# Patient Record
Sex: Male | Born: 2000 | Hispanic: No | Marital: Single | Smoking: Never smoker
Health system: Southern US, Community
[De-identification: ages and names within clinical notes are randomized; demographics above are authoritative.]

## PROBLEM LIST (undated history)

## (undated) ENCOUNTER — Emergency Department (HOSPITAL_COMMUNITY): Admission: EM | Payer: Self-pay | Source: Home / Self Care

---
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
## 2021-07-14 ENCOUNTER — Ambulatory Visit: Payer: BC Managed Care – PPO | Admitting: Pediatrics

## 2021-07-19 ENCOUNTER — Ambulatory Visit: Payer: BC Managed Care – PPO | Admitting: Pediatrics

## 2021-07-24 NOTE — Progress Notes (Deleted)
Adolescent Well Care Visit Kenneth Vaughan is a 20 y.o. male who is here for well care.    PCP:  Roxy Horseman, MD   History was provided by the {CHL AMB PERSONS; PED RELATIVES/OTHER W/PATIENT:[phone]}.  Confidentiality was discussed with the patient and, if applicable, with caregiver as well. Patient's personal or confidential phone number: ***  Current Issues: Current concerns include ***.   ***transition to adult provider No shows 09/01/20, 12/11,/21 canceled apts 07/14/21, 07/19/21  Nutrition: Nutrition/eating behaviors: *** Adequate calcium in diet?: *** Supplements/ vitamins: ***  Exercise/ Media: Play any sports? *** Exercise: *** Screen time:  {CHL AMB SCREEN [phone]} Media rules or monitoring?: {YES NO:22349}  Sleep:  Sleep: ***  Social Screening: Lives with:  ***mom, dad, sibs Parental relations:  {CHL AMB PED FAM RELATIONSHIPS:[phone]} Activities, work, and chores?: *** Concerns regarding behavior with peers?  {yes***/no:17258} Stressors of note: {Responses; yes**/no:17258}  Education: School grade and name: *** college? School performance: {performance:16655} School behavior: {misc; parental coping:16655}  Menstruation:   No LMP for male patient. Menstrual history: ***   Tobacco?  {YES/NO/WILD CARDS:18581} Secondhand smoke exposure?  {YES/NO/WILD GHWEX:93716} Drugs/ETOH?  {YES/NO/WILD RCVEL:38101}  Sexually Active?  {YES [id number]   Pregnancy Prevention: ***  Safe at home, in school & in relationships?  {Yes or If no, why not?:20788} Safe to self?  {Yes or If no, why not?:20788}   Screenings: Patient has a dental home: {yes/no***:64::"yes"}  The patient completed the Rapid Assessment for Adolescent Preventive Services screening questionnaire and the following topics were identified as risk factors and discussed: {CHL AMB ASSESSMENT TOPICS:21012045} and counseling provided.  Other topics of anticipatory guidance related to  reproductive health, substance use and media use were discussed.     PHQ-9 completed and results indicated ***  Physical Exam:  There were no vitals filed for this visit. There were no vitals taken for this visit. Body mass index: body mass index is unknown because there is no height or weight on file. Blood pressure percentiles are not available for patients who are 18 years or older.  No results found.  General Appearance:   {PE GENERAL APPEARANCE:22457}  HENT: normocephalic, no obvious abnormality, conjunctiva clear  Mouth:   oropharynx moist, palate, tongue and gums normal; teeth ***  Neck:   supple, no adenopathy; thyroid: symmetric, no enlargement, no tenderness/mass/nodules  Chest Normal male male with breasts: {EXAMLarrie Kass  Lungs:   clear to auscultation bilaterally, even air movement   Heart:   regular rate and rhythm, S1 and S2 normal, no murmurs   Abdomen:   soft, non-tender, normal bowel sounds; no mass, or organomegaly  GU {adol gu exam:315266}  Musculoskeletal:   tone and strength strong and symmetrical, all extremities full range of motion           Lymphatic:   no adenopathy  Skin/Hair/Nails:   skin warm and dry; no bruises, no rashes, no lesions  Neurologic:   oriented, no focal deficits; strength, gait, and coordination normal and age-appropriate     Assessment and Plan:   ***  BMI {ACTION; IS/IS BPZ:02585277} appropriate for age  Hearing screening result:{normal/abnormal/not examined:14677} Vision screening result: {normal/abnormal/not examined:14677}  Counseling provided for {CHL AMB PED VACCINE COUNSELING:210130100} vaccine components No orders of the defined types were placed in this encounter.    No follow-ups on file.Renato Gails, MD

## 2021-07-26 ENCOUNTER — Encounter: Payer: Self-pay | Admitting: Student in an Organized Health Care Education/Training Program

## 2021-07-26 ENCOUNTER — Ambulatory Visit: Payer: Medicaid Other | Admitting: Student in an Organized Health Care Education/Training Program

## 2021-07-26 IMAGING — DX DG CHEST 1V PORT
1 series · 1 of 1 positions shown · non-contrast
Comparison: None.

CLINICAL DATA: Fever for 3 days, generalized body aches

EXAM:
PORTABLE CHEST 1 VIEW

[chest]
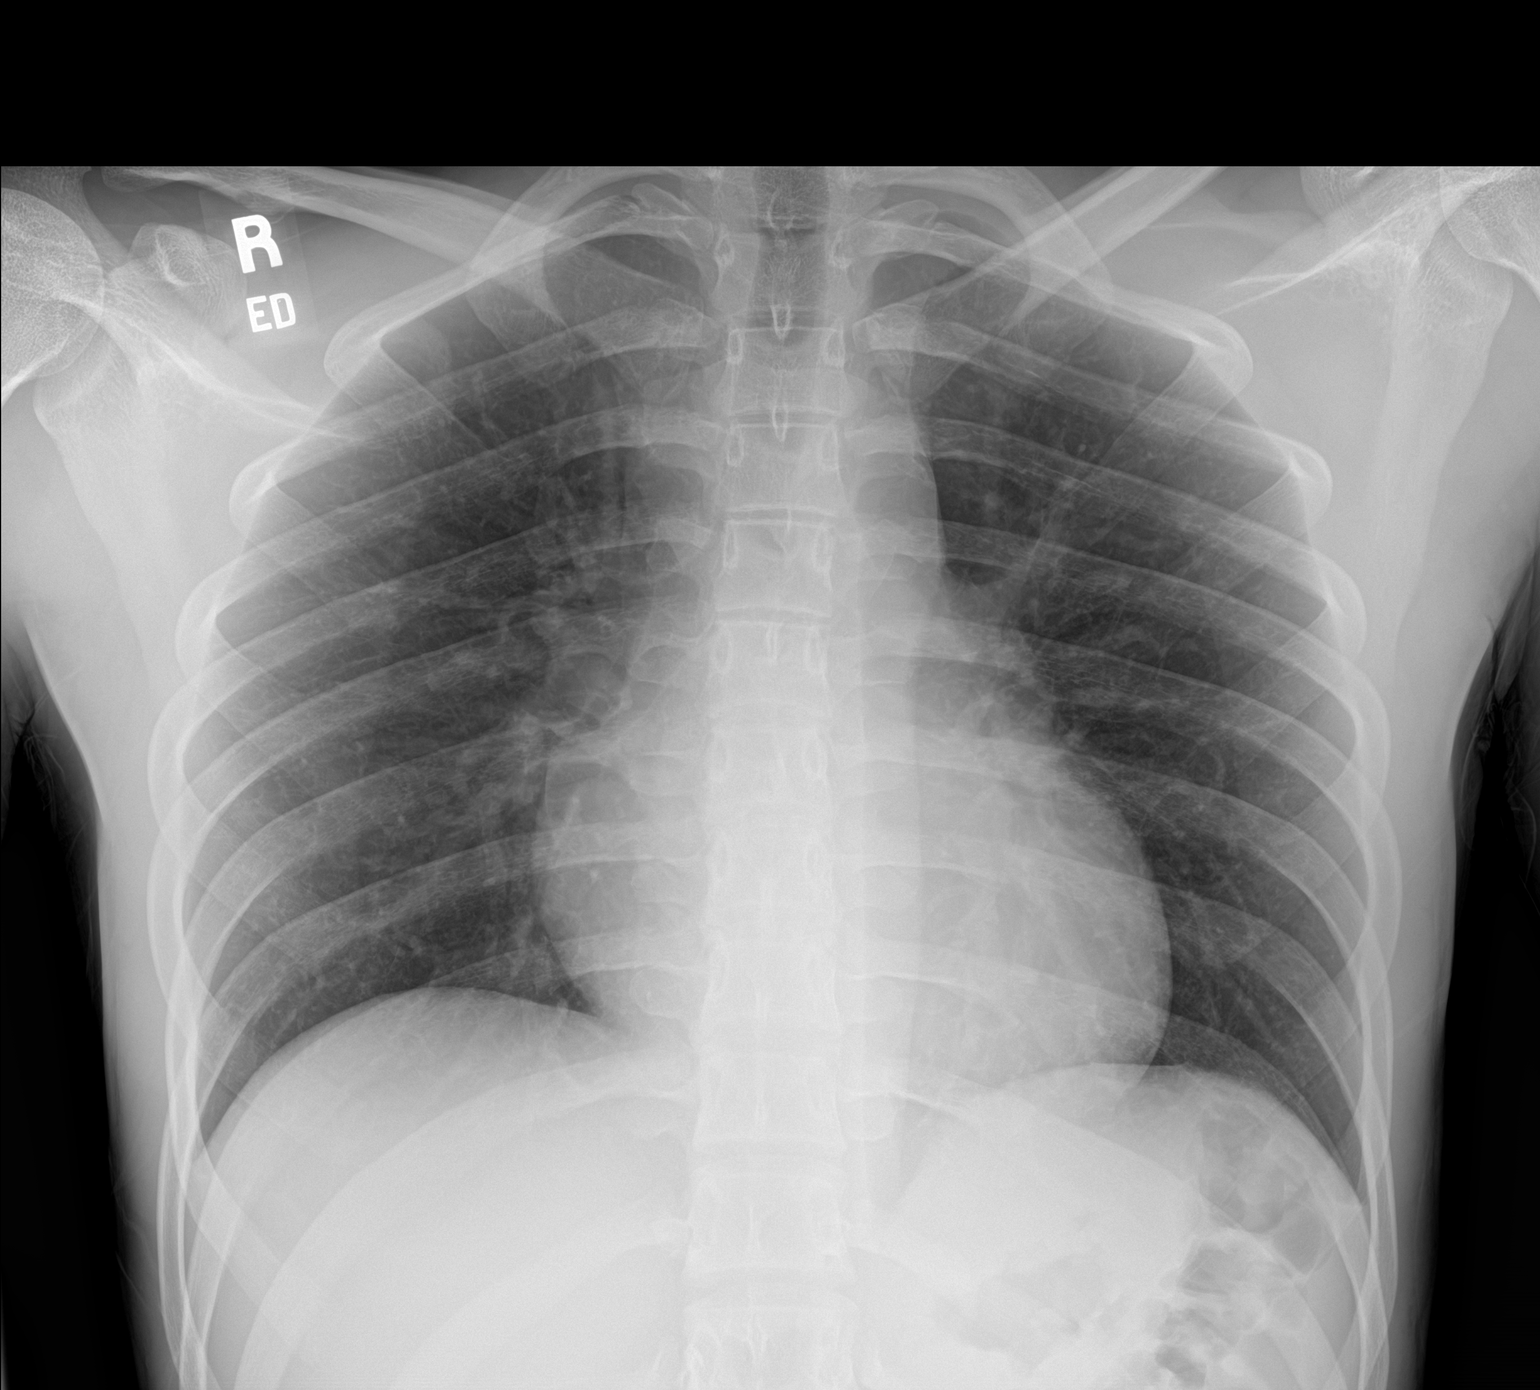

[1 of 1 positions shown; findings below may reference images not displayed]

FINDINGS: The heart size and mediastinal contours are within normal limits.
Both lungs are clear. The visualized skeletal structures are
unremarkable.
IMPRESSION: No active disease.

## 2021-07-30 ENCOUNTER — Ambulatory Visit (INDEPENDENT_AMBULATORY_CARE_PROVIDER_SITE_OTHER): Payer: Medicaid Other | Admitting: Pediatrics

## 2021-07-30 ENCOUNTER — Other Ambulatory Visit: Payer: Self-pay

## 2021-07-30 VITALS — HR 59 | Temp 99.2°F | Wt 159.4 lb

## 2021-07-30 DIAGNOSIS — S76111A Strain of right quadriceps muscle, fascia and tendon, initial encounter: Secondary | ICD-10-CM | POA: Diagnosis not present

## 2021-07-30 DIAGNOSIS — J069 Acute upper respiratory infection, unspecified: Secondary | ICD-10-CM | POA: Diagnosis not present

## 2021-07-30 NOTE — Patient Instructions (Addendum)
You were seen in clinic for cough and congestion. Your symptoms are consistent with a viral upper respiratory tract infection. Please self-swab at home for COVID.   1. Timeline for the common cold: Symptoms typically peak at 2-3 days of illness and then gradually improve over 10-14 days. However, a cough may last 2-4 weeks.   2. Please drink plenty of fluids. Eating warm liquids such as chicken soup or tea may also help with nasal congestion.  3. You can take acetaminophen (Tylenol) every 4-6 hours for pain or fever or take Ibuprofen (Advil or Motrin) every 6-8 hours for pain or fever. You may also alternate Tylenol with ibuprofen by giving one medication every 3 hours.   4.  You can buy a saline nose spray at the grocery store or the pharmacy to help with congestion.   5. You can have a spoonful of honey a few times a time. Older children may also suck on a hard candy or lozenge while awake. Can also try camomile or peppermint tea.  6. Please call your doctor if you are: Refusing to drink anything for a prolonged period Having behavior changes, including irritability or lethargy (decreased responsiveness) Having difficulty breathing, working hard to breathe, or breathing rapidly Has fever greater than 101F (38.4C) for more than three days Nasal congestion that does not improve or worsens over the course of 14 days The eyes become red or develop yellow discharge There are signs or symptoms of an ear infection (pain, drainage) Cough lasts more than 3 weeks   Lastly, you most likely strained your right quadriceps muscle. Continue with rest, elevation and Ibuprofen as needed as described above. Muscle strains can take 3-4 weeks to heal with proper rest. If you continue to experience pain after the next couple of weeks please call for re-evaluation, [facility] at [phone].

## 2021-07-30 NOTE — Progress Notes (Addendum)
History was provided by the patient.  Kenneth Vaughan is a 20 y.o. previously healthy male who is here for cough and congestion.     HPI:  Since Tuesday Kenneth Vaughan has felt sick. He had chest congestion, rhinorrhea, and was spitting out mucus with cough since Tuesday. When he coughs he has some chest tightness. He feels his symptoms are getting slightly better, especially yesterday but is a little worse today. Has tried Zyrtec and Ibuprofen as well as having warm tea to soothe throat. Is unsure if these remedies have helped. No rash, fever, difficulty breathing, chest pain, vomiting, diarrhea, dysuria, hematuria, nor bloody stools. He has had a normal appetite, eating and drinking normally. He is voiding and stooling regularly. Has not taken a COVID test at home and does not have any at home. 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 Lives with parents and his brother who had COVID 2-3 weeks ago. He is UTD on vaccines but did not get the covid vaccine. He has no medical problems, no surgeries, no meds, no allergies. He thinks this is a cold but was slightly concerned because had similar symptoms around Labor Day.   Also concerned for his right quad muscle. He was playing in a soccer game 2 weeks ago when he kicked the ball with his right leg and he heard a pop. He felt it was better this past week after symptomatic therapy at home with rest, ice, elevation and a massage gun, but getting out of the car today some pain returned. He is able to walk on it, but has pain with application of pressure and trying to stand up. No bruising, weakness, numbness nor tingling.    Physical Exam:  Pulse (!) 59   Temp 99.2 F (37.3 C) (Oral)   Wt 159 lb 6.4 oz (72.3 kg)   SpO2 98%   Blood pressure percentiles are not available for patients who are 18 years or older.   GEN: Well appearing, cooperative male teen, in no acute distress  HEENT: Normocephalic, atraumatic. PERRL.  Conjunctiva clear. TM normal bilaterally with bilateral soft cerumen. Nares patent with some crusted rhinorrhea and minor erythematous hypertrophied inferior turbinates. Oropharynx moist with no erythema, edema or exudate.  Neck: Supple. No lymphadenopathy.  CV: Regular rate and rhythm. No murmurs, rubs or gallops. Normal capillary refill. No peripheral edema.   RESP: Normal work of breathing. Lungs clear to auscultation bilaterally with no wheezes, rales nor crackles.  GI: Abdomen soft, normal bowel sounds, non-tender, non-distended with no hepatosplenomegaly or masses.  GU: Not examined.  MSK: Grossly normal, active and moving without pain. Right LE quad muscle and tendon without tenderness to palpation nor swelling. Full ROM in bilateral hip flexion, extension, external and internal rotation. Full ROM in  bilateral knee flexion, extension, external and internal rotation.   NEURO: No focal deficits. Normal tone. LE strength 5/5 bilaterally except 4/5 on right in hip flexion. LE sensation intact bilaterally. LE DTRs 2+ bilaterally. Normal gait.    Assessment/Plan:  Kenneth Vaughan is a previously healthy 20 year old male who is presenting with 4 days of cough and congestion. Today he is well appearing, afebrile, not in any respiratory distress, with minor crusted rhinorrhea, and otherwise has an unremarkable exam. Presentation most consistent with viral URI.  Patient opted to self swab at home for COVID and I encouraged to continue symptomatic care at home. Patient's lower extremity exam was within normal limits, including full range of  motion at both right hip and knee without any tenderness to palpation nor swelling on exam of the right quadriceps muscle tendon or knee. Given patient's history of a soccer injury 2 weeks ago and today's presentation, likely right quadriceps muscle strain.  Encouraged patient to continue with rest, elevation, and NSAIDs as needed. Provided him the number for the Sports  Medicine clinic should symptoms not improve in the next couple of weeks.  1. Viral URI with cough  2. Quadriceps muscle strain, right, initial encounter  - Immunizations today: None  - Follow-up as needed should symptoms worsen. Recommended establish care with adult / family medicine for PCP given 20 years of age. Also provided number for Sports Medicine should symptoms worsen.    Arlyce Harman, DO  07/30/21

## 2021-07-31 ENCOUNTER — Encounter: Payer: Self-pay | Admitting: Pediatrics

## 2021-10-20 ENCOUNTER — Ambulatory Visit: Payer: Medicaid Other | Admitting: Pediatrics

## 2021-10-20 ENCOUNTER — Encounter: Payer: Self-pay | Admitting: Pediatrics

## 2021-10-20 NOTE — Progress Notes (Deleted)
Adolescent Well Care Visit Kenneth Vaughan is a 21 y.o. male who is here for well care.    PCP:  Roxy Horseman, MD   History was provided by the {CHL AMB PERSONS; PED RELATIVES/OTHER W/PATIENT:[phone]}.  Confidentiality was discussed with the patient and, if applicable, with caregiver as well. Patient's personal or confidential phone number: ***  Current Issues: Current concerns include ***.   Last wcc was April 2021 H/o Seasonal allergies- zyrtec Mild intermittent asthma Acne Plantar wart  Nutrition: Nutrition/eating behaviors: *** Adequate calcium in diet?: *** Supplements/ vitamins: ***  Exercise/ Media: Play any sports? *** Exercise: *** Screen time:  {CHL AMB SCREEN [phone]} Media rules or monitoring?: {YES NO:22349}  Sleep:  Sleep: ***  Social Screening: Lives with:  *** Parental relations:  {CHL AMB PED FAM RELATIONSHIPS:[phone]} Activities, work, and chores?: *** Concerns regarding behavior with peers?  {yes***/no:17258} Stressors of note: {Responses; yes**/no:17258}  Education: School grade and name: ***  School performance: {performance:16655} School behavior: {misc; parental coping:16655}  Menstruation:   No LMP for male patient. Menstrual history: ***   Tobacco?  {YES/NO/WILD CARDS:18581} Secondhand smoke exposure?  {YES/NO/WILD IDPOE:42353} Drugs/ETOH?  {YES/NO/WILD IRWER:15400}  Sexually Active?  {YES [id number]   Pregnancy Prevention: ***  Safe at home, in school & in relationships?  {Yes or If no, why not?:20788} Safe to self?  {Yes or If no, why not?:20788}   Screenings: Patient has a dental home: {yes/no***:64::"yes"}  The patient completed the Rapid Assessment for Adolescent Preventive Services screening questionnaire and the following topics were identified as risk factors and discussed: {CHL AMB ASSESSMENT TOPICS:[id number]} and counseling provided.  Other topics of anticipatory guidance related to reproductive health,  substance use and media use were discussed.     PHQ-9 completed and results indicated ***  Physical Exam:  There were no vitals filed for this visit. There were no vitals taken for this visit. Body mass index: body mass index is unknown because there is no height or weight on file. Blood pressure percentiles are not available for patients who are 18 years or older.  No results found.  General Appearance:   {PE GENERAL APPEARANCE:22457}  HENT: normocephalic, no obvious abnormality, conjunctiva clear  Mouth:   oropharynx moist, palate, tongue and gums normal; teeth ***  Neck:   supple, no adenopathy; thyroid: symmetric, no enlargement, no tenderness/mass/nodules  Chest Normal male male with breasts: {EXAMLarrie Kass  Lungs:   clear to auscultation bilaterally, even air movement   Heart:   regular rate and rhythm, S1 and S2 normal, no murmurs   Abdomen:   soft, non-tender, normal bowel sounds; no mass, or organomegaly  GU {adol gu exam:315266}  Musculoskeletal:   tone and strength strong and symmetrical, all extremities full range of motion           Lymphatic:   no adenopathy  Skin/Hair/Nails:   skin warm and dry; no bruises, no rashes, no lesions  Neurologic:   oriented, no focal deficits; strength, gait, and coordination normal and age-appropriate     Assessment and Plan:   ***  BMI {ACTION; IS/IS QQP:[id number]} appropriate for age  Hearing screening result:{normal/abnormal/not examined:14677} Vision screening result: {normal/abnormal/not examined:14677}  Counseling provided for {CHL AMB PED VACCINE COUNSELING:210130100} vaccine components No orders of the defined types were placed in this encounter.    No follow-ups on file.Renato Gails, MD

## 2023-01-27 ENCOUNTER — Ambulatory Visit: Payer: Medicaid Other | Admitting: Internal Medicine

## 2023-08-02 ENCOUNTER — Ambulatory Visit: Payer: Medicaid Other | Admitting: Family

## 2023-08-24 ENCOUNTER — Ambulatory Visit: Payer: Self-pay | Admitting: Family

## 2023-08-24 ENCOUNTER — Other Ambulatory Visit (HOSPITAL_COMMUNITY)
Admission: RE | Admit: 2023-08-24 | Discharge: 2023-08-24 | Disposition: A | Discharge status: Home / Self Care | Payer: BC Managed Care – PPO | Source: Ambulatory Visit | Attending: Family | Admitting: Family

## 2023-08-24 VITALS — HR 60 | Ht 73.0 in

## 2023-08-24 DIAGNOSIS — Z0000 Encounter for general adult medical examination without abnormal findings: Secondary | ICD-10-CM | POA: Diagnosis not present

## 2023-08-24 DIAGNOSIS — F17200 Nicotine dependence, unspecified, uncomplicated: Secondary | ICD-10-CM | POA: Insufficient documentation

## 2023-08-24 DIAGNOSIS — Z113 Encounter for screening for infections with a predominantly sexual mode of transmission: Secondary | ICD-10-CM

## 2023-08-24 DIAGNOSIS — Z1159 Encounter for screening for other viral diseases: Secondary | ICD-10-CM

## 2023-08-24 LAB — LIPID PANEL
Cholesterol: 110 mg/dL (ref 0–200)
HDL: 48.3 mg/dL (ref >39.00)
LDL Cholesterol: 54 mg/dL (ref 0–99)
NonHDL: 61.65
Total CHOL/HDL Ratio: 2
Triglycerides: 38 mg/dL (ref 0.0–149.0)
VLDL: 7.6 mg/dL (ref 0.0–40.0)

## 2023-08-24 LAB — CBC WITH DIFFERENTIAL/PLATELET
Basophils Absolute: 0 10*3/uL (ref 0.0–0.1)
Basophils Relative: 0.7 % (ref 0.0–3.0)
Eosinophils Absolute: 0.2 10*3/uL (ref 0.0–0.7)
Eosinophils Relative: 5.8 % — ABNORMAL HIGH (ref 0.0–5.0)
HCT: 43.4 % (ref 39.0–52.0)
Hemoglobin: 14.4 g/dL (ref 13.0–17.0)
Lymphocytes Relative: 40.7 % (ref 12.0–46.0)
Lymphs Abs: 1.2 10*3/uL (ref 0.7–4.0)
MCHC: 33.2 g/dL (ref 30.0–36.0)
MCV: 83 fL (ref 78.0–100.0)
Monocytes Absolute: 0.2 10*3/uL (ref 0.1–1.0)
Monocytes Relative: 7.1 % (ref 3.0–12.0)
Neutro Abs: 1.4 10*3/uL (ref 1.4–7.7)
Neutrophils Relative %: 45.7 % (ref 43.0–77.0)
Platelets: 253 10*3/uL (ref 150.0–400.0)
RBC: 5.23 Mil/uL (ref 4.22–5.81)
RDW: 13.1 % (ref 11.5–15.5)
WBC: 3 10*3/uL — ABNORMAL LOW (ref 4.0–10.5)

## 2023-08-24 LAB — COMPREHENSIVE METABOLIC PANEL
ALT: 9 U/L (ref 0–53)
AST: 13 U/L (ref 0–37)
Albumin: 4.4 g/dL (ref 3.5–5.2)
Alkaline Phosphatase: 82 U/L (ref 39–117)
BUN: 9 mg/dL (ref 6–23)
CO2: 31 meq/L (ref 19–32)
Calcium: 9.7 mg/dL (ref 8.4–10.5)
Chloride: 103 meq/L (ref 96–112)
Creatinine, Ser: 0.92 mg/dL (ref 0.40–1.50)
GFR: 118.85 mL/min (ref >60.00)
Glucose, Bld: 106 mg/dL — ABNORMAL HIGH (ref 70–99)
Potassium: 4.1 meq/L (ref 3.5–5.1)
Sodium: 139 meq/L (ref 135–145)
Total Bilirubin: 0.5 mg/dL (ref 0.2–1.2)
Total Protein: 7.3 g/dL (ref 6.0–8.3)

## 2023-08-24 LAB — TSH: TSH: 1.24 u[IU]/mL (ref 0.35–5.50)

## 2023-08-24 NOTE — Progress Notes (Signed)
Phone: [phone]  Subjective:  Patient 22 y.o. male presenting for annual physical.  Chief Complaint  Patient presents with   New Patient (Initial Visit)   Annual Exam    Not-fasting w/ labs   Discussed the use of AI scribe software for clinical note transcription with the patient, who gave verbal consent to proceed.  History of Present Illness   Kenneth Vaughan, a young adult, presents to establish care and for a physical. He has no chronic medical conditions and is not on any medications. He has a history of using a nebulizer as a child, but does not recall the specifics. 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 He reports daily vaping with nicotine, a habit he picked up in high school and is trying to quit. He also reports no alcohol use. [place] is physically active, playing soccer weekly in a league and going to the gym a few times a week. He has not eaten today but did have coffee with cream and sugar. He is interested in STD testing and will provide a urine sample.     See problem oriented charting- ROS- full  review of systems was completed and negative.  The following were reviewed and entered/updated in epic: Past Medical History:  Diagnosis Date   Need for vaccination 07/19/2016   10/10 per [facility] patient needs hav, HPV, Tdap, and influenza vaccinations. Mother reports vaccines are UTD and will fill out ROI for vaccination records. Prefers to wait on influenza until other vaccinations.   Patient Active Problem List   Diagnosis Date Noted   Vaping nicotine dependence, non-tobacco product 08/24/2023   No past surgical history on file.  No family history on file.  Medications- reviewed and updated Current Outpatient Medications  Medication Sig Dispense Refill   albuterol (PROVENTIL) (2.5 MG/3ML) 0.083% nebulizer solution Take 3 mLs (2.5 mg total) by nebulization every 6 (six) hours as needed for wheezing or shortness of breath. 75 mL 0    cetirizine (ZYRTEC) 10 MG tablet Take 1 tablet (10 mg total) by mouth daily. 30 tablet 12   No current facility-administered medications for this visit.    Allergies-reviewed and updated No Known Allergies  Social History   Social History Narrative   Not on file    Objective:  Pulse 60   Ht 6\' 1"  (1.854 m)   BMI 21.03 kg/m  Physical Exam Vitals and nursing note reviewed.  Constitutional:      General: He is not in acute distress.    Appearance: Normal appearance.  HENT:     Head: Normocephalic.     Right Ear: Tympanic membrane and external ear normal.     Left Ear: Tympanic membrane and external ear normal.     Nose: Nose normal.     Mouth/Throat:     Mouth: Mucous membranes are moist.  Eyes:     Extraocular Movements: Extraocular movements intact.  Cardiovascular:     Rate and Rhythm: Normal rate and regular rhythm.  Pulmonary:     Effort: Pulmonary effort is normal.     Breath sounds: Normal breath sounds.  Abdominal:     General: Abdomen is flat. There is no distension.     Palpations: Abdomen is soft.     Tenderness: There is no abdominal tenderness.  Musculoskeletal:        General: Normal range of motion.     Cervical back: Normal range of motion.  Skin:    General:  Skin is warm and dry.  Neurological:     Mental Status: He is alert and oriented to person, place, and time.  Psychiatric:        Mood and Affect: Mood normal.        Behavior: Behavior normal.        Judgment: Judgment normal.     Assessment and Plan   Health Maintenance counseling: 1. Anticipatory guidance: Patient counseled regarding regular dental exams q6 months, eye exams yearly, avoiding smoking and second hand smoke, limiting alcohol to 2 beverages per day.   2. Risk factor reduction:  Advised patient of need for regular exercise and diet rich in fruits and vegetables to reduce risk of heart attack and stroke.    Wt Readings from Last 3 Encounters:  07/30/21 159 lb 6.4 oz (72.3  kg) (58%, Z= 0.20)*  08/25/20 150 lb 6.4 oz (68.2 kg) (49%, Z= -0.01)*  01/13/20 150 lb 3.2 oz (68.1 kg) (54%, Z= 0.09)*   * Growth percentiles are based on CDC (Boys, 2-20 Years) data.   3. Immunizations/screenings/ancillary studies Immunization History  Administered Date(s) Administered   DTaP 03/27/2002, 05/24/2002, 07/24/2002, 08/16/2004, 02/09/2007   HIB (PRP-OMP) 03/27/2002, 05/24/2002, 07/24/2002, 02/03/2003   HPV 9-valent 10/24/2018   Hepatitis A, Ped/Adol-2 Dose 09/13/2017, 10/24/2018   Hepatitis B 09/11/02, 07/24/2002, 11/19/2002, 02/03/2003   IPV 05/24/2002, 07/24/2002, 11/19/2002, 02/09/2007   Influenza-Unspecified 06/24/2008   MMR 02/03/2003, 02/09/2007   Meningococcal Conjugate 05/30/2014, 10/24/2018   Pneumococcal Conjugate PCV 7 03/27/2002   Polio, Unspecified 02/09/2007   Td 06/27/2013   Tdap 06/27/2013   Varicella 02/03/2003, 02/09/2007   Health Maintenance Due  Topic Date Due   INFLUENZA VACCINE  07/22/2008   Meningococcal B Vaccine  Never done   HPV VACCINES (2 - Male 3-dose series) 11/21/2018   Hepatitis C Screening  Never done   COVID-19 Vaccine (1 - 2023-24 season) Never done   DTaP/Tdap/Td (8 - Td or Tdap) 06/28/2023    4. Skin cancer screening-  advised regular sunscreen use. Denies worrisome, changing, or new skin lesions.  5. Smoking associated screening:  smoker - vaping with nicotine since HS 6. STD screening - will order today 7. Alcohol screening: none  Assessment and Plan    Nicotine Dependence (Vaping) - Patient reports daily vaping. Discussed the risks associated with vaping including lung infections and vasoconstriction leading to potential stroke or heart attack. Patient expressed interest in quitting. -Encouraged to quit vaping. -Suggested nicotine patches as a potential aid in quitting.  Annual Physical -  Patient is active with regular soccer and gym workouts. Discussed the importance of maintaining physical activity. Patient  reports suboptimal hydration. -Check CPE labs today -Encouraged to increase daily water intake to at least 2 liters. -Declined flu shot.  STD Screening - Patient agreed to STD testing and Hepatitis C screening. -Order urine STD testing and Hepatitis C screening.  Follow-up Encouraged patient to use MyChart for access to medical information and lab results. No immediate follow-up appointment scheduled.      Recommended follow up: No follow-ups on file. Future Appointments  Date Time Provider Department Center  08/27/2024  8:30 AM Dulce Sellar, NP [facility]    Lab/Order associations: not- fasting - coffee with cream and sugar    Dulce Sellar, NP

## 2023-08-24 NOTE — Patient Instructions (Addendum)
Welcome to [facility], It was a pleasure meeting you today!    I will review your lab results via MyChart next week.   Have a great week!      PLEASE NOTE: If you had any LAB tests please let us know if you have not heard back within a few days. You may see your results on MyChart before we have a chance to review them but we will give you a call once they are reviewed by [place]. If we ordered any REFERRALS today, please let us know if you have not heard from their office within the next week.  Let us know through MyChart if you are needing REFILLS, or have your pharmacy send [place] the request. You can also use MyChart to communicate with me or any office staff.  Please try these tips to maintain a healthy lifestyle: It is important that you exercise regularly at least 30 minutes 5 times a week. Think about what you will eat, plan ahead. Choose whole foods, & think  "clean, green, fresh or frozen" over canned, processed or packaged foods which are more sugary, salty, and fatty. 70 to 75% of food eaten should be fresh vegetables and protein. 2-3  meals daily with healthy snacks between meals, but must be whole fruit, protein or vegetables. Aim to eat over a 10 hour period when you are active, for example, 7am to 5pm, and then STOP after your last meal of the day, drinking only water.  Shorter eating windows, 6-8 hours, are showing benefits in heart disease and blood sugar regulation. Drink water every day! Shoot for 64 ounces daily = 8 cups, no other drink is as healthy! Fruit juice is best enjoyed in a healthy way, by EATING the fruit.

## 2023-08-25 LAB — URINE CYTOLOGY ANCILLARY ONLY
Chlamydia: NEGATIVE
Comment: NEGATIVE
Comment: NEGATIVE
Comment: NORMAL
Neisseria Gonorrhea: NEGATIVE
Trichomonas: NEGATIVE

## 2023-08-25 LAB — HEPATITIS C ANTIBODY: Hepatitis C Ab: NONREACTIVE

## 2023-08-28 ENCOUNTER — Other Ambulatory Visit: Payer: Self-pay

## 2023-08-28 DIAGNOSIS — R7989 Other specified abnormal findings of blood chemistry: Secondary | ICD-10-CM

## 2023-08-28 NOTE — Progress Notes (Signed)
labs all look good - just white blood count a little low & eosinophils a little high - most likely due to allergy sx - we can recheck this in a few weeks if he would like. Hep C & urine all negative (no STDs).

## 2023-09-11 ENCOUNTER — Other Ambulatory Visit (INDEPENDENT_AMBULATORY_CARE_PROVIDER_SITE_OTHER): Payer: BC Managed Care – PPO

## 2023-09-11 DIAGNOSIS — R7989 Other specified abnormal findings of blood chemistry: Secondary | ICD-10-CM | POA: Diagnosis not present

## 2023-09-11 LAB — CBC WITH DIFFERENTIAL/PLATELET
Basophils Absolute: 0 10*3/uL (ref 0.0–0.1)
Basophils Relative: 0.7 % (ref 0.0–3.0)
Eosinophils Absolute: 0.3 10*3/uL (ref 0.0–0.7)
Eosinophils Relative: 9.3 % — ABNORMAL HIGH (ref 0.0–5.0)
HCT: 42.9 % (ref 39.0–52.0)
Hemoglobin: 14.3 g/dL (ref 13.0–17.0)
Lymphocytes Relative: 35.5 % (ref 12.0–46.0)
Lymphs Abs: 1 10*3/uL (ref 0.7–4.0)
MCHC: 33.3 g/dL (ref 30.0–36.0)
MCV: 83.8 fL (ref 78.0–100.0)
Monocytes Absolute: 0.3 10*3/uL (ref 0.1–1.0)
Monocytes Relative: 8.6 % (ref 3.0–12.0)
Neutro Abs: 1.4 10*3/uL (ref 1.4–7.7)
Neutrophils Relative %: 45.9 % (ref 43.0–77.0)
Platelets: 222 10*3/uL (ref 150.0–400.0)
RBC: 5.12 Mil/uL (ref 4.22–5.81)
RDW: 13.5 % (ref 11.5–15.5)
WBC: 3 10*3/uL — ABNORMAL LOW (ref 4.0–10.5)

## 2024-08-27 ENCOUNTER — Encounter: Payer: BC Managed Care – PPO | Admitting: Family

## 2024-09-09 ENCOUNTER — Encounter: Admitting: Family

## 2024-09-24 ENCOUNTER — Encounter: Payer: Self-pay | Admitting: Family
